# Patient Record
Sex: Female | Born: 1965 | Race: White | Hispanic: No | Marital: Married | Smoking: Current every day smoker
Health system: Southern US, Community
[De-identification: ages and names within clinical notes are randomized; demographics above are authoritative.]

---

## 2017-06-02 ENCOUNTER — Emergency Department
Admission: EM | Admit: 2017-06-02 | Discharge: 2017-06-02 | Disposition: A | Discharge status: Home / Self Care | Payer: No Typology Code available for payment source | Attending: Emergency Medicine | Admitting: Emergency Medicine

## 2017-06-02 ENCOUNTER — Emergency Department: Payer: No Typology Code available for payment source

## 2017-06-02 ENCOUNTER — Encounter: Payer: Self-pay | Admitting: *Deleted

## 2017-06-02 DIAGNOSIS — S39012A Strain of muscle, fascia and tendon of lower back, initial encounter: Secondary | ICD-10-CM | POA: Diagnosis not present

## 2017-06-02 DIAGNOSIS — S199XXA Unspecified injury of neck, initial encounter: Secondary | ICD-10-CM | POA: Diagnosis present

## 2017-06-02 DIAGNOSIS — Y9389 Activity, other specified: Secondary | ICD-10-CM | POA: Insufficient documentation

## 2017-06-02 DIAGNOSIS — Y999 Unspecified external cause status: Secondary | ICD-10-CM | POA: Diagnosis not present

## 2017-06-02 DIAGNOSIS — S161XXA Strain of muscle, fascia and tendon at neck level, initial encounter: Secondary | ICD-10-CM | POA: Diagnosis not present

## 2017-06-02 DIAGNOSIS — F17200 Nicotine dependence, unspecified, uncomplicated: Secondary | ICD-10-CM | POA: Diagnosis not present

## 2017-06-02 DIAGNOSIS — Y92410 Unspecified street and highway as the place of occurrence of the external cause: Secondary | ICD-10-CM | POA: Diagnosis not present

## 2017-06-02 MED ORDER — TRAMADOL HCL 50 MG PO TABS: 50.0000 mg | ORAL_TABLET | Freq: Four times a day (QID) | ORAL | 0 refills | Status: AC | PRN

## 2017-06-02 MED ORDER — TRAMADOL HCL 50 MG PO TABS
ORAL_TABLET | ORAL | Status: AC
  Filled 2017-06-02: qty 1

## 2017-06-02 MED ORDER — IBUPROFEN 600 MG PO TABS: 600.0000 mg | ORAL_TABLET | Freq: Three times a day (TID) | ORAL | 0 refills | Status: AC | PRN

## 2017-06-02 MED ORDER — CYCLOBENZAPRINE HCL 10 MG PO TABS
ORAL_TABLET | ORAL | Status: AC
  Filled 2017-06-02: qty 1

## 2017-06-02 MED ORDER — IBUPROFEN 600 MG PO TABS
600.0000 mg | ORAL_TABLET | Freq: Once | ORAL | Status: AC
  Administered 2017-06-02: 600 mg via ORAL
  Filled 2017-06-02: qty 1

## 2017-06-02 MED ORDER — TRAMADOL HCL 50 MG PO TABS: 50.0000 mg | ORAL_TABLET | Freq: Once | ORAL | Status: DC

## 2017-06-02 MED ORDER — CYCLOBENZAPRINE HCL 10 MG PO TABS: 10.0000 mg | ORAL_TABLET | Freq: Once | ORAL | Status: DC

## 2017-06-02 MED ORDER — CYCLOBENZAPRINE HCL 10 MG PO TABS: 10.0000 mg | ORAL_TABLET | Freq: Three times a day (TID) | ORAL | 0 refills | Status: AC | PRN
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
## 2017-06-02 NOTE — ED Notes (Signed)
Pt alert and oriented X4, active, cooperative, pt in NAD. RR even and unlabored, color WNL.  Discharge and followup instructions reviewed.
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
